# Patient Record
Sex: Male | Born: 1990 | Race: White | Hispanic: No | Marital: Married | Smoking: Never smoker
Health system: Southern US, Community
[De-identification: ages and names within clinical notes are randomized; demographics above are authoritative.]

---

## 2016-11-11 DIAGNOSIS — J020 Streptococcal pharyngitis: Secondary | ICD-10-CM | POA: Diagnosis not present

## 2017-09-11 DIAGNOSIS — Z0000 Encounter for general adult medical examination without abnormal findings: Secondary | ICD-10-CM | POA: Diagnosis not present

## 2020-08-19 ENCOUNTER — Other Ambulatory Visit: Payer: Self-pay

## 2020-08-19 ENCOUNTER — Encounter (HOSPITAL_BASED_OUTPATIENT_CLINIC_OR_DEPARTMENT_OTHER): Payer: Self-pay

## 2020-08-19 ENCOUNTER — Other Ambulatory Visit (HOSPITAL_BASED_OUTPATIENT_CLINIC_OR_DEPARTMENT_OTHER): Payer: Self-pay | Admitting: Emergency Medicine

## 2020-08-19 ENCOUNTER — Emergency Department (HOSPITAL_BASED_OUTPATIENT_CLINIC_OR_DEPARTMENT_OTHER)
Admission: EM | Admit: 2020-08-19 | Discharge: 2020-08-19 | Disposition: A | Discharge status: Home / Self Care | Payer: 59 | Attending: Emergency Medicine | Admitting: Emergency Medicine

## 2020-08-19 ENCOUNTER — Emergency Department (HOSPITAL_BASED_OUTPATIENT_CLINIC_OR_DEPARTMENT_OTHER): Payer: 59

## 2020-08-19 DIAGNOSIS — N132 Hydronephrosis with renal and ureteral calculous obstruction: Secondary | ICD-10-CM | POA: Insufficient documentation

## 2020-08-19 DIAGNOSIS — R1031 Right lower quadrant pain: Secondary | ICD-10-CM | POA: Diagnosis present

## 2020-08-19 DIAGNOSIS — N201 Calculus of ureter: Secondary | ICD-10-CM

## 2020-08-19 LAB — BASIC METABOLIC PANEL
Anion gap: 11 (ref 5–15)
BUN: 16 mg/dL (ref 6–20)
CO2: 26 mmol/L (ref 22–32)
Calcium: 9.5 mg/dL (ref 8.9–10.3)
Chloride: 100 mmol/L (ref 98–111)
Creatinine, Ser: 1.05 mg/dL (ref 0.61–1.24)
GFR, Estimated: >60 mL/min (ref >60)
Glucose, Bld: 132 mg/dL — ABNORMAL HIGH (ref 70–99)
Potassium: 4.3 mmol/L (ref 3.5–5.1)
Sodium: 137 mmol/L (ref 135–145)

## 2020-08-19 LAB — URINALYSIS, ROUTINE W REFLEX MICROSCOPIC
Bilirubin Urine: NEGATIVE
Glucose, UA: NEGATIVE mg/dL
Ketones, ur: NEGATIVE mg/dL
Leukocytes,Ua: NEGATIVE
Nitrite: NEGATIVE
Protein, ur: 30 mg/dL — AB
Specific Gravity, Urine: 1.015 (ref 1.005–1.030)
pH: 8 (ref 5.0–8.0)

## 2020-08-19 LAB — CBC WITH DIFFERENTIAL/PLATELET
Abs Immature Granulocytes: 0.01 10*3/uL (ref 0.00–0.07)
Basophils Absolute: 0.1 10*3/uL (ref 0.0–0.1)
Basophils Relative: 1 %
Eosinophils Absolute: 0 10*3/uL (ref 0.0–0.5)
Eosinophils Relative: 0 %
HCT: 45.3 % (ref 39.0–52.0)
Hemoglobin: 15.3 g/dL (ref 13.0–17.0)
Immature Granulocytes: 0 %
Lymphocytes Relative: 7 %
Lymphs Abs: 0.6 10*3/uL — ABNORMAL LOW (ref 0.7–4.0)
MCH: 31 pg (ref 26.0–34.0)
MCHC: 33.8 g/dL (ref 30.0–36.0)
MCV: 91.7 fL (ref 80.0–100.0)
Monocytes Absolute: 0.4 10*3/uL (ref 0.1–1.0)
Monocytes Relative: 4 %
Neutro Abs: 7.7 10*3/uL (ref 1.7–7.7)
Neutrophils Relative %: 88 %
Platelets: 256 10*3/uL (ref 150–400)
RBC: 4.94 MIL/uL (ref 4.22–5.81)
RDW: 12.6 % (ref 11.5–15.5)
WBC: 8.8 10*3/uL (ref 4.0–10.5)
nRBC: 0 % (ref 0.0–0.2)

## 2020-08-19 LAB — URINALYSIS, MICROSCOPIC (REFLEX): RBC / HPF: >50 RBC/hpf (ref 0–5)

## 2020-08-19 MED ORDER — KETOROLAC TROMETHAMINE 30 MG/ML IJ SOLN
30.0000 mg | Freq: Once | INTRAMUSCULAR | Status: AC
  Administered 2020-08-19: 30 mg via INTRAVENOUS
  Filled 2020-08-19: qty 1

## 2020-08-19 MED ORDER — IBUPROFEN 800 MG PO TABS: 800.0000 mg | ORAL_TABLET | Freq: Three times a day (TID) | ORAL | 0 refills | Status: DC | PRN

## 2020-08-19 MED ORDER — HYDROCODONE-ACETAMINOPHEN 5-325 MG PO TABS: 1.0000 | ORAL_TABLET | Freq: Four times a day (QID) | ORAL | 0 refills | Status: DC | PRN

## 2020-08-19 MED ORDER — TAMSULOSIN HCL 0.4 MG PO CAPS: 0.4000 mg | ORAL_CAPSULE | Freq: Every day | ORAL | 0 refills | Status: DC

## 2020-08-19 MED FILL — HYDROCODON-APAP 5-325: 5-325 | 3 days supply | Qty: 12 | Fill #0

## 2020-08-19 MED FILL — IBUPROFEN 800 MG TAB: 800 | 10 days supply | Qty: 30 | Fill #0

## 2020-08-19 MED FILL — TAMSULOSIN HCL 0.4 MG CAP: 0.4 | 10 days supply | Qty: 10 | Fill #0

## 2020-08-19 NOTE — ED Triage Notes (Signed)
Pt arrives with c/o 10/10 RLQ reports that he works in a [employer] and urinated in a cup this morning at work and did have blood in his urine.

## 2020-08-19 NOTE — ED Notes (Signed)
IV attempted in L forearm without success.

## 2020-08-19 NOTE — ED Provider Notes (Signed)
[facility] EMERGENCY DEPARTMENT Provider Note  CSN: [id number] Arrival date & time: 08/19/20 0848    History Chief Complaint  Patient presents with   Abdominal Pain    HPI  Travis Whitaker is a 30 y.o. male with no significant PMH reports sudden onset of RLQ abdominal pain this morning around 0700. Moderate to severe aching pain, does not radiating to R flank. 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 He denies any fever or vomiting. Still has appendix.    History reviewed. No pertinent past medical history.  History reviewed. No pertinent surgical history.  No family history on file.  Social History   Tobacco Use   Smoking status: Never Smoker   Smokeless tobacco: Never Used  Substance Use Topics   Alcohol use: Yes    Comment: social   Drug use: Never     Home Medications Prior to Admission medications   Medication Sig Start Date End Date Taking? Authorizing Provider  buPROPion (WELLBUTRIN XL) 150 MG 24 hr tablet Take 150 mg by mouth daily. 07/28/20  Yes [provider]  HYDROcodone-acetaminophen (NORCO/VICODIN) 5-325 MG tablet Take 1 tablet by mouth every 6 (six) hours as needed for severe pain. 08/19/20  Yes Pollyann Savoy, MD  ibuprofen (ADVIL) 800 MG tablet Take 1 tablet (800 mg total) by mouth every 8 (eight) hours as needed. 08/19/20  Yes Pollyann Savoy, MD  tamsulosin (FLOMAX) 0.4 MG CAPS capsule Take 1 capsule (0.4 mg total) by mouth daily. 08/19/20  Yes Pollyann Savoy, MD     Allergies    Penicillins   Review of Systems   Review of Systems A comprehensive review of systems was completed and negative except as noted in HPI.    Physical Exam BP 111/77 (BP Location: Right Arm)    Pulse 78    Temp 97.7 F (36.5 C) (Oral)    Resp 16    Ht 5\' 10"  (1.778 m)    Wt 70.3 kg    SpO2 100%    BMI 22.24 kg/m   Physical Exam Vitals and nursing note  reviewed.  Constitutional:      Appearance: Normal appearance.  HENT:     Head: Normocephalic and atraumatic.     Nose: Nose normal.     Mouth/Throat:     Mouth: Mucous membranes are moist.  Eyes:     Extraocular Movements: Extraocular movements intact.     Conjunctiva/sclera: Conjunctivae normal.  Cardiovascular:     Rate and Rhythm: Normal rate.  Pulmonary:     Effort: Pulmonary effort is normal.     Breath sounds: Normal breath sounds.  Abdominal:     General: Abdomen is flat.     Palpations: Abdomen is soft.     Tenderness: There is abdominal tenderness in the right lower quadrant. There is no guarding. Negative signs include Murphy's sign and McBurney's sign.  Musculoskeletal:        General: No swelling. Normal range of motion.     Cervical back: Neck supple.  Skin:    General: Skin is warm and dry.  Neurological:     General: No focal deficit present.     Mental Status: He is alert.  Psychiatric:        Mood and Affect: Mood normal.      ED Results / Procedures / Treatments   Labs (all labs ordered are  listed, but only abnormal results are displayed) Labs Reviewed  BASIC METABOLIC PANEL - Abnormal; Notable for the following components:      Result Value   Glucose, Bld 132 (*)    All other components within normal limits  CBC WITH DIFFERENTIAL/PLATELET - Abnormal; Notable for the following components:   Lymphs Abs 0.6 (*)    All other components within normal limits  URINALYSIS, ROUTINE W REFLEX MICROSCOPIC - Abnormal; Notable for the following components:   Color, Urine BROWN (*)    APPearance TURBID (*)    Hgb urine dipstick LARGE (*)    Protein, ur 30 (*)    All other components within normal limits  URINALYSIS, MICROSCOPIC (REFLEX) - Abnormal; Notable for the following components:   Bacteria, UA FEW (*)    All other components within normal limits    EKG None   Radiology CT Renal Stone Study  Result Date: 08/19/2020 CLINICAL DATA:  Lower  abdominal pain with nausea and vomiting. Hematuria. EXAM: CT ABDOMEN AND PELVIS WITHOUT CONTRAST TECHNIQUE: Multidetector CT imaging of the abdomen and pelvis was performed following the standard protocol without oral or IV contrast. COMPARISON:  None. FINDINGS: Lower chest: Lung bases are clear. Hepatobiliary: No focal liver lesions are evident on this noncontrast enhanced study. Gallbladder wall is not appreciably thickened. There is no biliary duct dilatation. Pancreas: There is no pancreatic mass or inflammatory focus. Spleen: No splenic lesions are evident. Adrenals/Urinary Tract: Adrenals bilaterally appear normal. There is no evident renal mass on either side. There is mild hydronephrosis on the right. There is no appreciable hydronephrosis on the left. There are several tiny calculi in the mid right kidney. No intrarenal calculi on the left. There is a calculus in the proximal right ureter at the L4 level measuring 4 x 3 mm. There is mild edema involving the proximal right ureter proximal to this calculus. No other ureteral calculi evident on either side. Urinary bladder is midline with wall thickness within normal limits. Stomach/Bowel: There is no appreciable bowel wall or mesenteric thickening. There is moderate stool in the colon. Terminal ileum appears normal. No evident bowel obstruction. No free air or portal venous air. Vascular/Lymphatic: No abdominal aortic aneurysm. No vascular lesions evident on this noncontrast enhanced study. There is no evident adenopathy in the abdomen or pelvis. Reproductive: Prostate and seminal vesicles are normal in size and contour. Other: Normal appearing appendix. No abscess or ascites evident in the abdomen or pelvis. Musculoskeletal: No blastic or lytic bone lesions. No intramuscular or abdominal wall lesion. IMPRESSION: 1. 4 x 3 mm calculus in the proximal right ureter at the L4 level causing mild right hydronephrosis and proximal right ureteral edema. 2.  Several  tiny nonobstructing calculi in the right kidney. Three. No bowel wall thickening or bowel obstruction. No abscess in the abdomen or pelvis. Appendix appears normal. Electronically Signed   By: Bretta Bang III M.D.   On: 08/19/2020 10:31    Procedures Procedures  Medications Ordered in the ED Medications  ketorolac (TORADOL) 30 MG/ML injection 30 mg (30 mg Intravenous Given 08/19/20 1001)     MDM Rules/Calculators/A&P MDM Patient with RLQ pain, reportedly had hematuria at work today. Suspect distal ureteral stone, less likely appendicitis as he does not currently have any peritoneal signs. Will give Toradol for pain, check labs and send for CT stone study.  ED Course  I have reviewed the triage vital signs and the nursing notes.  Pertinent labs & imaging results that were  available during my care of the patient were reviewed by me and considered in my medical decision making (see chart for details).  Clinical Course as of 08/19/20 1200  Thu Aug 19, 2020  1003 CBC is normal.  [CS]  1021 BMP is normal.  [CS]  1022 CT images reviewed, appears to show a 51mm mid ureteral stone with mild-mod hydronephrosis. No inflammatory change in RLQ to suggest appendicitis. Awaiting rads read. [CS]  1157 UA confirms hematuria, no signs of infection. Patient reports pain is well controlled. Plan discharge with Rx for Flomax, Motrin and Norco for breakthrough pain. Urology follow up if he does not pass the stone in the next few days.  [CS]    Clinical Course User Index [CS] Pollyann Savoy, MD    Final Clinical Impression(s) / ED Diagnoses Final diagnoses:  Ureteral stone    Rx / DC Orders ED Discharge Orders         Ordered    ibuprofen (ADVIL) 800 MG tablet  Every 8 hours PRN        08/19/20 1200    tamsulosin (FLOMAX) 0.4 MG CAPS capsule  Daily        08/19/20 1200    HYDROcodone-acetaminophen (NORCO/VICODIN) 5-325 MG tablet  Every 6 hours PRN        08/19/20 1200            Pollyann Savoy, MD 08/19/20 1200

## 2020-08-19 NOTE — ED Notes (Signed)
ED Provider at bedside. 

## 2020-08-27 ENCOUNTER — Emergency Department (HOSPITAL_BASED_OUTPATIENT_CLINIC_OR_DEPARTMENT_OTHER)
Admission: EM | Admit: 2020-08-27 | Discharge: 2020-08-27 | Disposition: A | Discharge status: Home / Self Care | Payer: 59 | Attending: Emergency Medicine | Admitting: Emergency Medicine

## 2020-08-27 ENCOUNTER — Other Ambulatory Visit: Payer: Self-pay

## 2020-08-27 ENCOUNTER — Encounter (HOSPITAL_BASED_OUTPATIENT_CLINIC_OR_DEPARTMENT_OTHER): Payer: Self-pay

## 2020-08-27 DIAGNOSIS — R339 Retention of urine, unspecified: Secondary | ICD-10-CM | POA: Diagnosis present

## 2020-08-27 DIAGNOSIS — N200 Calculus of kidney: Secondary | ICD-10-CM | POA: Insufficient documentation

## 2020-08-27 LAB — URINALYSIS, MICROSCOPIC (REFLEX)

## 2020-08-27 LAB — BASIC METABOLIC PANEL
Anion gap: 9 (ref 5–15)
BUN: 15 mg/dL (ref 6–20)
CO2: 26 mmol/L (ref 22–32)
Calcium: 9.2 mg/dL (ref 8.9–10.3)
Chloride: 96 mmol/L — ABNORMAL LOW (ref 98–111)
Creatinine, Ser: 0.99 mg/dL (ref 0.61–1.24)
GFR, Estimated: >60 mL/min (ref >60)
Glucose, Bld: 106 mg/dL — ABNORMAL HIGH (ref 70–99)
Potassium: 3.7 mmol/L (ref 3.5–5.1)
Sodium: 131 mmol/L — ABNORMAL LOW (ref 135–145)

## 2020-08-27 LAB — URINALYSIS, ROUTINE W REFLEX MICROSCOPIC
Bilirubin Urine: NEGATIVE
Glucose, UA: NEGATIVE mg/dL
Ketones, ur: NEGATIVE mg/dL
Leukocytes,Ua: NEGATIVE
Nitrite: NEGATIVE
Protein, ur: NEGATIVE mg/dL
Specific Gravity, Urine: 1.02 (ref 1.005–1.030)
pH: 6 (ref 5.0–8.0)

## 2020-08-27 MED ORDER — KETOROLAC TROMETHAMINE 15 MG/ML IJ SOLN
15.0000 mg | Freq: Once | INTRAMUSCULAR | Status: AC
  Administered 2020-08-27: 15 mg via INTRAVENOUS
  Filled 2020-08-27: qty 1

## 2020-08-27 NOTE — ED Notes (Signed)
Lab aware of urine order, urine taken to lab by triage RN

## 2020-08-27 NOTE — ED Provider Notes (Signed)
[facility] EMERGENCY DEPARTMENT Provider Note   CSN: [id number] Arrival date & time: 08/27/20  7425     History Chief Complaint  Patient presents with   Urinary Retention    Travis Whitaker is a 30 y.o. male.  Patient presents with urinary retention since this morning.  Patient diagnosed with kidney stone on January 13 but has been unable to urinate except for small amount this morning.  Patient reports bladder pressure.  No history of similar.  No fevers chills or vomiting.  No history of kidney stones prior to this week.  Patient has been taking Flomax, his pain has been controlled.  Patient received Toradol at the time of diagnosis that helped his pain.        History reviewed. No pertinent past medical history.  There are no problems to display for this patient.   History reviewed. No pertinent surgical history.     History reviewed. No pertinent family history.  Social History   Tobacco Use   Smoking status: Never Smoker   Smokeless tobacco: Never Used  Substance Use Topics   Alcohol use: Yes    Comment: social   Drug use: Never    Home Medications Prior to Admission medications   Medication Sig Start Date End Date Taking? Authorizing Provider  buPROPion (WELLBUTRIN XL) 150 MG 24 hr tablet Take 150 mg by mouth daily. 07/28/20   [provider]  HYDROcodone-acetaminophen (NORCO/VICODIN) 5-325 MG tablet Take 1 tablet by mouth every 6 (six) hours as needed for severe pain. 08/19/20   Pollyann Savoy, MD  ibuprofen (ADVIL) 800 MG tablet Take 1 tablet (800 mg total) by mouth every 8 (eight) hours as needed. 08/19/20   Pollyann Savoy, MD  tamsulosin (FLOMAX) 0.4 MG CAPS capsule Take 1 capsule (0.4 mg total) by mouth daily. 08/19/20   Pollyann Savoy, MD    Allergies    Penicillins  Review of Systems   Review of Systems  Constitutional: Negative for chills and fever.  HENT: Negative for congestion.   Eyes: Negative for visual  disturbance.  Respiratory: Negative for shortness of breath.   Cardiovascular: Negative for chest pain.  Gastrointestinal: Negative for abdominal pain and vomiting.  Genitourinary: Positive for decreased urine volume. Negative for dysuria and flank pain.  Musculoskeletal: Negative for back pain, neck pain and neck stiffness.  Skin: Negative for rash.  Neurological: Negative for light-headedness and headaches.    Physical Exam Updated Vital Signs BP 118/64    Pulse 70    Temp 97.6 F (36.4 C) (Oral)    Resp 16    Ht 5\' 10"  (1.778 m)    Wt 70.3 kg    SpO2 100%    BMI 22.24 kg/m   Physical Exam Vitals and nursing note reviewed.  Constitutional:      Appearance: He is well-developed and well-nourished.  HENT:     Head: Normocephalic and atraumatic.  Eyes:     General:        Right eye: No discharge.        Left eye: No discharge.     Conjunctiva/sclera: Conjunctivae normal.  Neck:     Trachea: No tracheal deviation.  Cardiovascular:     Rate and Rhythm: Normal rate.  Pulmonary:     Effort: Pulmonary effort is normal.  Abdominal:     General: There is no distension.     Palpations: Abdomen is soft.     Tenderness: There is abdominal tenderness (mild suprapubic).  There is no guarding.  Musculoskeletal:        General: No edema.     Cervical back: Normal range of motion and neck supple.  Skin:    General: Skin is warm.     Findings: No rash.  Neurological:     Mental Status: He is alert and oriented to person, place, and time.  Psychiatric:        Mood and Affect: Mood and affect normal.     ED Results / Procedures / Treatments   Labs (all labs ordered are listed, but only abnormal results are displayed) Labs Reviewed  BASIC METABOLIC PANEL - Abnormal; Notable for the following components:      Result Value   Sodium 131 (*)    Chloride 96 (*)    Glucose, Bld 106 (*)    All other components within normal limits  URINALYSIS, ROUTINE W REFLEX MICROSCOPIC - Abnormal;  Notable for the following components:   Color, Urine COLORLESS (*)    Hgb urine dipstick LARGE (*)    All other components within normal limits  URINALYSIS, MICROSCOPIC (REFLEX) - Abnormal; Notable for the following components:   Bacteria, UA FEW (*)    All other components within normal limits  URINE CULTURE    EKG None  Radiology No results found.  Procedures Ultrasound ED Renal  Date/Time: 08/27/2020 10:29 AM Performed by: Blane Ohara, MD Authorized by: Blane Ohara, MD   Procedure details:    Indications: hydronephrosis     Technique:  R kidney and bladder Right kidney findings:    Hydronephrosis: mild   Bladder findings:    Bladder:  Visualized   Volume:  Large   (including critical care time)  Medications Ordered in ED Medications  ketorolac (TORADOL) 15 MG/ML injection 15 mg (15 mg Intravenous Given 08/27/20 1007)    ED Course  I have reviewed the triage vital signs and the nursing notes.  Pertinent labs & imaging results that were available during my care of the patient were reviewed by me and considered in my medical decision making (see chart for details).    MDM Rules/Calculators/A&P                          Patient presents with urinary retention suprapubic pressure and discomfort since this morning.  Reviewed medical records showing CT scan with 4 x 3 mm kidney stone in the right.  Patient's blood work was reviewed and overall unremarkable, normal kidney function.  On exam patient well-appearing with mild discomfort, bedside ultrasound showed urinary retention, moderate urine in the bladder with mild hydronephrosis on the right.  Patient's vital signs reassuring, no fever, no vomiting.  Patient did produce small amount of urine in the ER.  Toradol given to help with discomfort and plan to observe until patient able to urinate normally if not catheter with urology follow-up.  Patient's pain improved in the ER, urinated multiple times.  Discussed close  follow-up with urology.  Urinalysis showed  Hematuria.  Kidney function normal, mild hyponatremia 131, reviewed.   Final Clinical Impression(s) / ED Diagnoses Final diagnoses:  Urinary retention  Right kidney stone    Rx / DC Orders ED Discharge Orders    None       Blane Ohara, MD 08/27/20 1152

## 2020-08-27 NOTE — Discharge Instructions (Signed)
Stay hydrated, take Tylenol every 4 hours and ibuprofen 600 mg every 6 hours as needed for pain.

## 2020-08-27 NOTE — ED Triage Notes (Signed)
Pt dx with kidney stone on 1/13 and today has had urinary urgency but unable to urinate but a small amount. Pt reports bladder pressure that comes and goes.

## 2020-08-27 NOTE — ED Notes (Signed)
Pt reports that he was able to void and feels like he was able to completely empty bladder.

## 2020-08-28 LAB — URINE CULTURE: Culture: NO GROWTH

## 2021-07-31 IMAGING — CT CT RENAL STONE PROTOCOL
2 of 4 series · 15 of 46 positions shown, 17 images · non-contrast
Comparison: None.

CLINICAL DATA: Lower abdominal pain with nausea and vomiting.
Hematuria.

EXAM:
CT ABDOMEN AND PELVIS WITHOUT CONTRAST
TECHNIQUE: Multidetector CT imaging of the abdomen and pelvis was performed
following the standard protocol without oral or IV contrast.

[Series 2: axial st · axial · 0.83mm/px · z∈[-546,-106]mm · 12 of 106 slices shown, 14 images]
[im 9/106  soft-tissue]
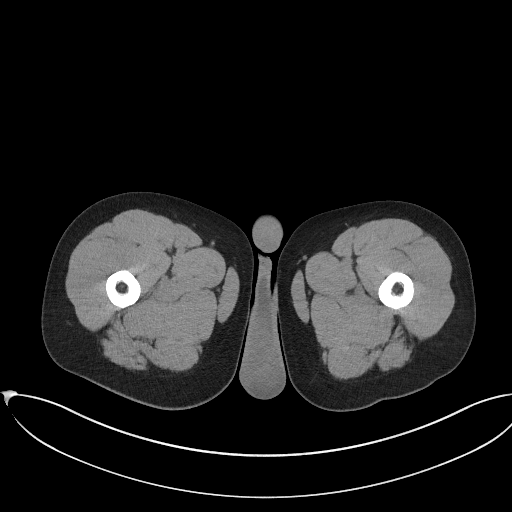
[im 9/106  bone]
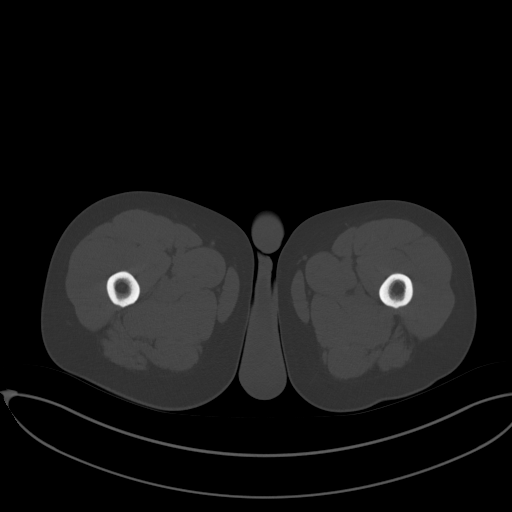
[im 17/106  soft-tissue]
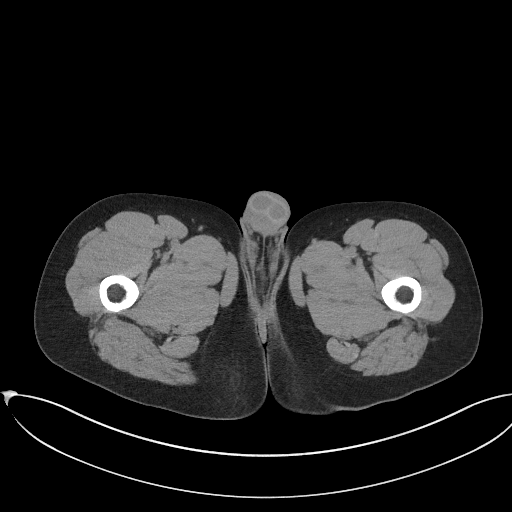
[im 25/106  soft-tissue]
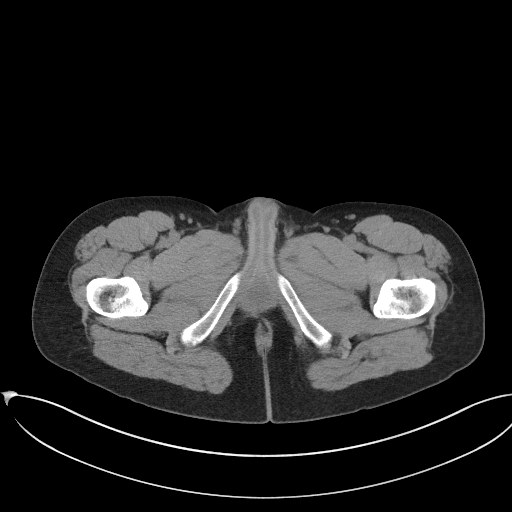
[im 33/106  soft-tissue]
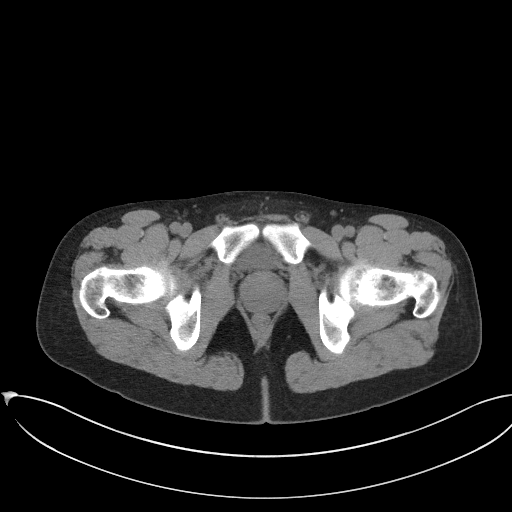
[im 41/106  soft-tissue]
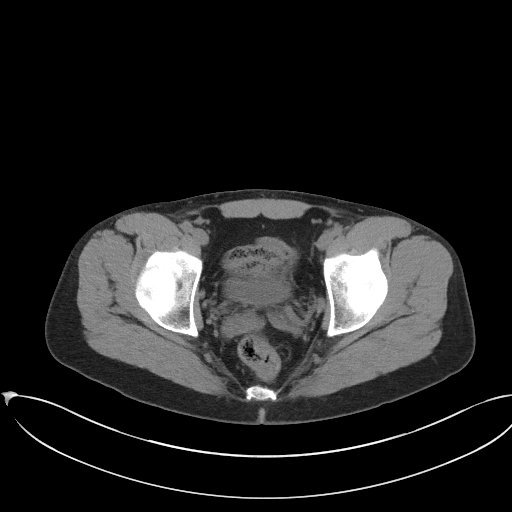
[im 49/106  soft-tissue]
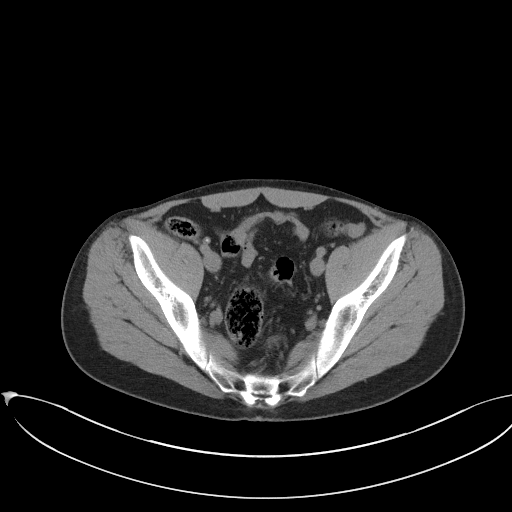
[im 57/106  soft-tissue]
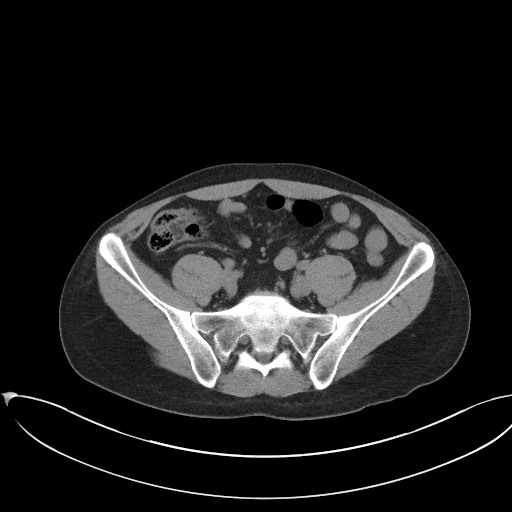
[im 65/106  soft-tissue]
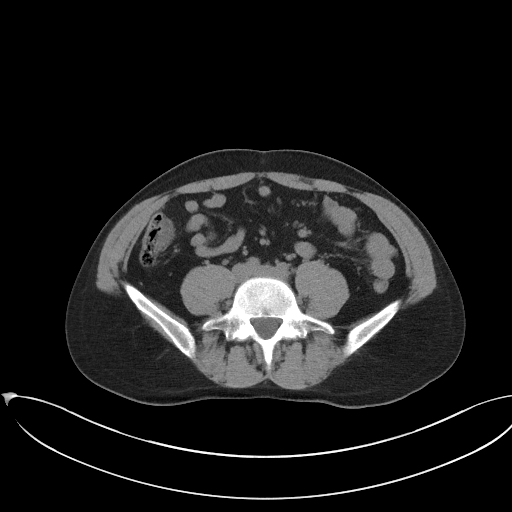
[im 73/106  soft-tissue]
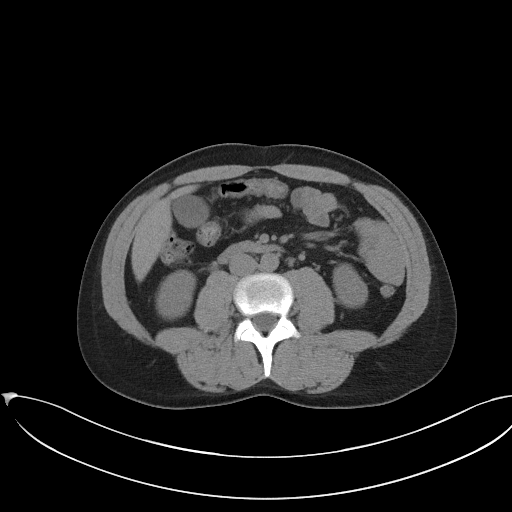
[im 73/106  bone]
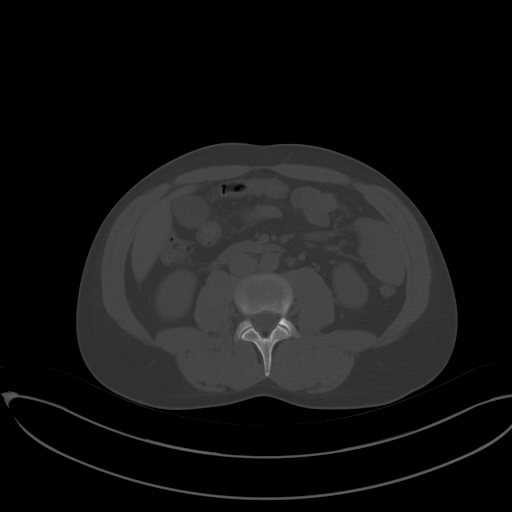
[im 81/106  soft-tissue]
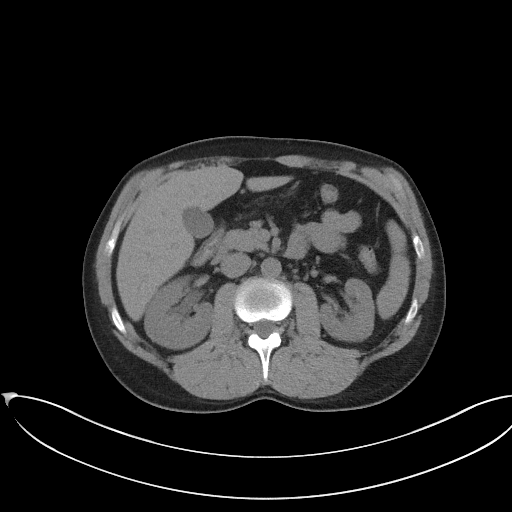
[im 89/106  soft-tissue]
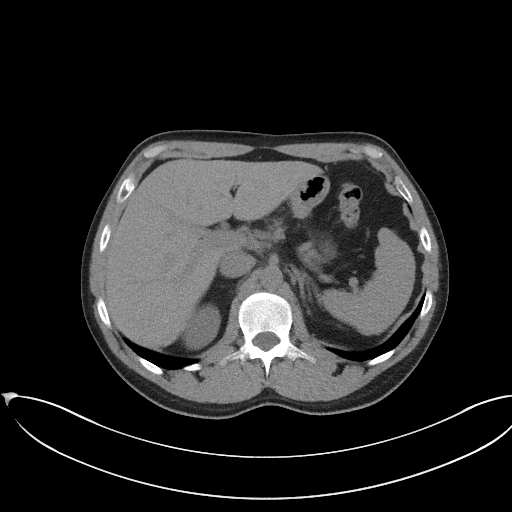
[im 97/106  soft-tissue]
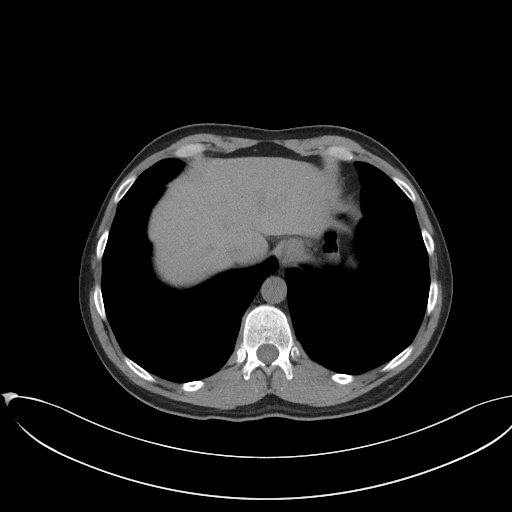

[Series 5: coronal st · coronal · 0.78mm/px · 3 of 84 slices shown]
[im 28/84  soft-tissue]
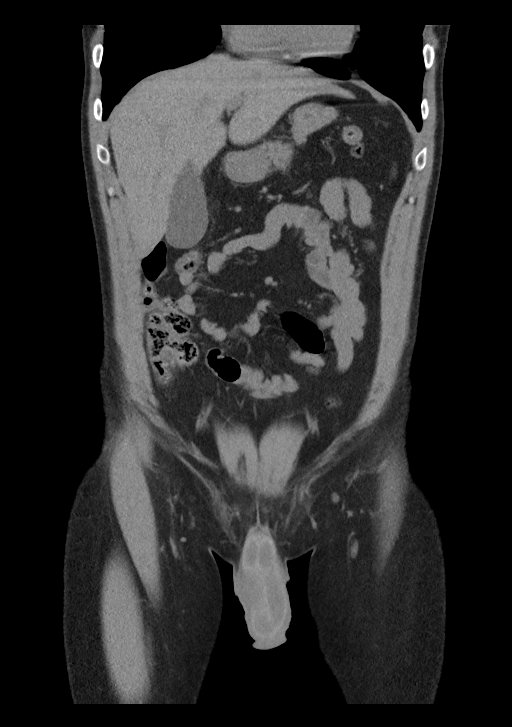
[im 37/84  soft-tissue]
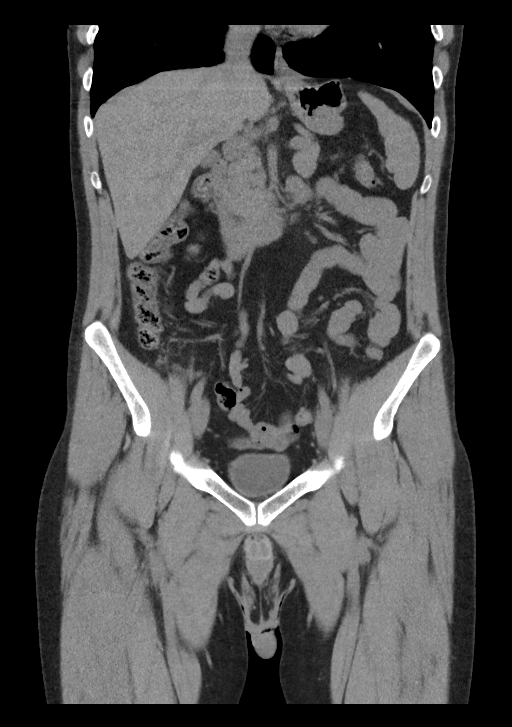
[im 47/84  soft-tissue]
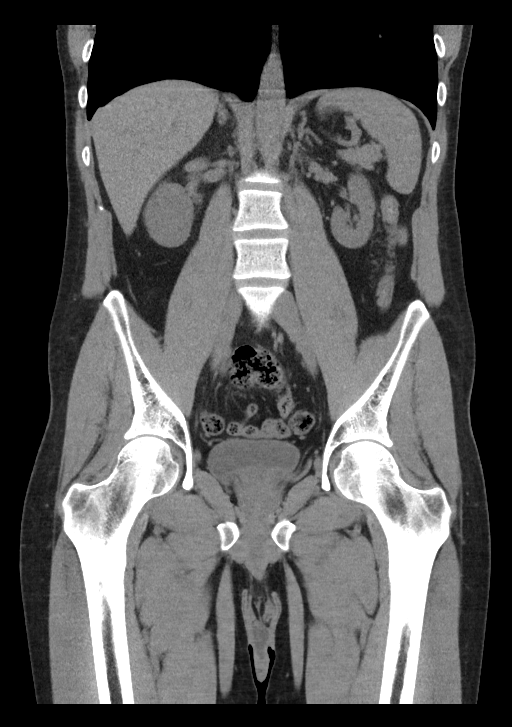

[15 of 46 positions shown; findings below may reference images not displayed]

FINDINGS: Lower chest: Lung bases are clear.

Hepatobiliary: No focal liver lesions are evident on this
noncontrast enhanced study. Gallbladder wall is not appreciably
thickened. There is no biliary duct dilatation.

Pancreas: There is no pancreatic mass or inflammatory focus.

Spleen: No splenic lesions are evident.

Adrenals/Urinary Tract: Adrenals bilaterally appear normal. There is
no evident renal mass on either side. There is mild hydronephrosis
on the right. There is no appreciable hydronephrosis on the left.
There are several tiny calculi in the mid right kidney. No
intrarenal calculi on the left. There is a calculus in the proximal
right ureter at the L4 level measuring 4 x 3 mm. There is mild edema
involving the proximal right ureter proximal to this calculus. No
other ureteral calculi evident on either side. Urinary bladder is
midline with wall thickness within normal limits.

Stomach/Bowel: There is no appreciable bowel wall or mesenteric
thickening. There is moderate stool in the colon. Terminal ileum
appears normal. No evident bowel obstruction. No free air or portal
venous air.

Vascular/Lymphatic: No abdominal aortic aneurysm. No vascular
lesions evident on this noncontrast enhanced study. There is no
evident adenopathy in the abdomen or pelvis.

Reproductive: Prostate and seminal vesicles are normal in size and
contour.

Other: Normal appearing appendix. No abscess or ascites evident in
the abdomen or pelvis.

Musculoskeletal: No blastic or lytic bone lesions. No intramuscular
or abdominal wall lesion.
IMPRESSION: 1. 4 x 3 mm calculus in the proximal right ureter at the L4 level
causing mild right hydronephrosis and proximal right ureteral edema.

2.  Several tiny nonobstructing calculi in the right kidney.

Three. No bowel wall thickening or bowel obstruction. No abscess in
the abdomen or pelvis. Appendix appears normal.

## 2023-08-22 DIAGNOSIS — F4325 Adjustment disorder with mixed disturbance of emotions and conduct: Secondary | ICD-10-CM | POA: Diagnosis not present

## 2023-08-22 DIAGNOSIS — Z630 Problems in relationship with spouse or partner: Secondary | ICD-10-CM | POA: Diagnosis not present

## 2023-08-30 DIAGNOSIS — F4325 Adjustment disorder with mixed disturbance of emotions and conduct: Secondary | ICD-10-CM | POA: Diagnosis not present

## 2023-08-30 DIAGNOSIS — Z630 Problems in relationship with spouse or partner: Secondary | ICD-10-CM | POA: Diagnosis not present

## 2023-09-18 DIAGNOSIS — F4325 Adjustment disorder with mixed disturbance of emotions and conduct: Secondary | ICD-10-CM | POA: Diagnosis not present

## 2023-09-18 DIAGNOSIS — Z630 Problems in relationship with spouse or partner: Secondary | ICD-10-CM | POA: Diagnosis not present

## 2023-10-03 DIAGNOSIS — Z630 Problems in relationship with spouse or partner: Secondary | ICD-10-CM | POA: Diagnosis not present

## 2023-10-03 DIAGNOSIS — F4325 Adjustment disorder with mixed disturbance of emotions and conduct: Secondary | ICD-10-CM | POA: Diagnosis not present

## 2023-10-11 DIAGNOSIS — F4325 Adjustment disorder with mixed disturbance of emotions and conduct: Secondary | ICD-10-CM | POA: Diagnosis not present

## 2023-10-11 DIAGNOSIS — Z630 Problems in relationship with spouse or partner: Secondary | ICD-10-CM | POA: Diagnosis not present

## 2023-12-20 DIAGNOSIS — Z630 Problems in relationship with spouse or partner: Secondary | ICD-10-CM | POA: Diagnosis not present

## 2023-12-20 DIAGNOSIS — F4325 Adjustment disorder with mixed disturbance of emotions and conduct: Secondary | ICD-10-CM | POA: Diagnosis not present

## 2024-01-02 DIAGNOSIS — Z630 Problems in relationship with spouse or partner: Secondary | ICD-10-CM | POA: Diagnosis not present

## 2024-01-02 DIAGNOSIS — F4325 Adjustment disorder with mixed disturbance of emotions and conduct: Secondary | ICD-10-CM | POA: Diagnosis not present

## 2024-01-07 ENCOUNTER — Ambulatory Visit (HOSPITAL_BASED_OUTPATIENT_CLINIC_OR_DEPARTMENT_OTHER): Admitting: Orthopaedic Surgery

## 2024-02-20 DIAGNOSIS — Z0000 Encounter for general adult medical examination without abnormal findings: Secondary | ICD-10-CM | POA: Diagnosis not present

## 2024-02-20 DIAGNOSIS — R5383 Other fatigue: Secondary | ICD-10-CM | POA: Diagnosis not present

## 2024-02-27 ENCOUNTER — Other Ambulatory Visit (HOSPITAL_BASED_OUTPATIENT_CLINIC_OR_DEPARTMENT_OTHER): Payer: Self-pay | Admitting: Internal Medicine

## 2024-02-27 DIAGNOSIS — E782 Mixed hyperlipidemia: Secondary | ICD-10-CM

## 2024-07-21 DIAGNOSIS — F4325 Adjustment disorder with mixed disturbance of emotions and conduct: Secondary | ICD-10-CM | POA: Diagnosis not present

## 2024-07-21 DIAGNOSIS — Z630 Problems in relationship with spouse or partner: Secondary | ICD-10-CM | POA: Diagnosis not present

## 2024-09-26 ENCOUNTER — Other Ambulatory Visit (HOSPITAL_BASED_OUTPATIENT_CLINIC_OR_DEPARTMENT_OTHER)
